# Patient Record
Sex: Female | Born: 1945 | Race: White | Hispanic: No | Marital: Married
Health system: Southern US, Community
[De-identification: ages and names within clinical notes are randomized; demographics above are authoritative.]

## PROBLEM LIST (undated history)

## (undated) DIAGNOSIS — I10 Essential (primary) hypertension: Secondary | ICD-10-CM

## (undated) HISTORY — DX: Essential (primary) hypertension: I10

---

## 2010-12-05 ENCOUNTER — Ambulatory Visit: Payer: Self-pay

## 2012-04-16 IMAGING — CR RIGHT HAND - COMPLETE 3+ VIEW
1 series · 4 of 4 positions shown · non-contrast
Comparison: none

REASON FOR EXAM: use of equipment fax results to [REDACTED]
COMMENTS:

[Series 1: view not recorded · 0.17mm/px · 4 of 4 slices shown]
[im 1/4]
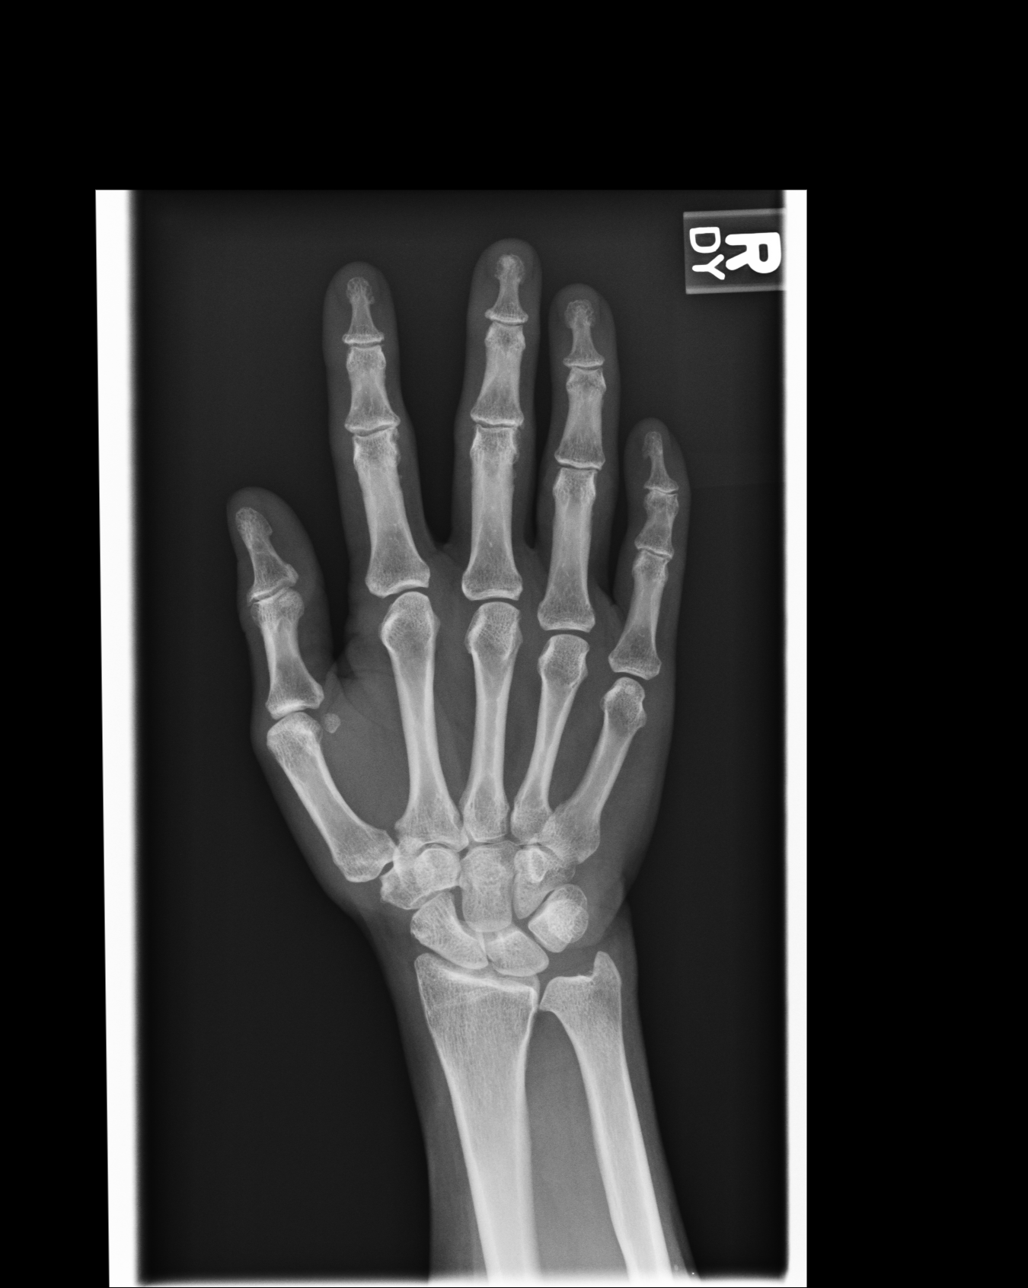
[im 2/4]
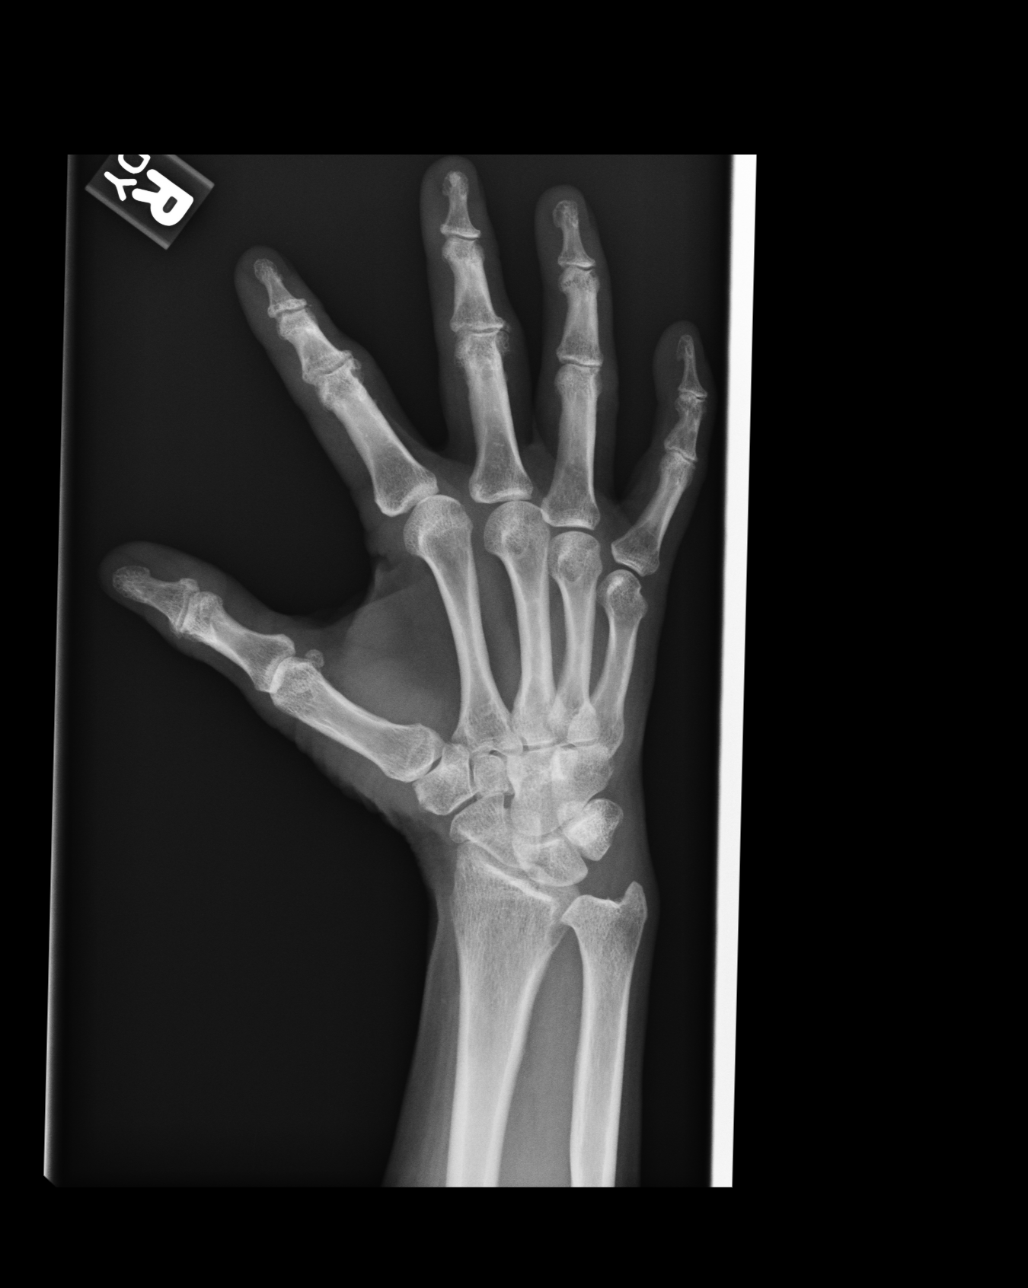
[im 3/4]
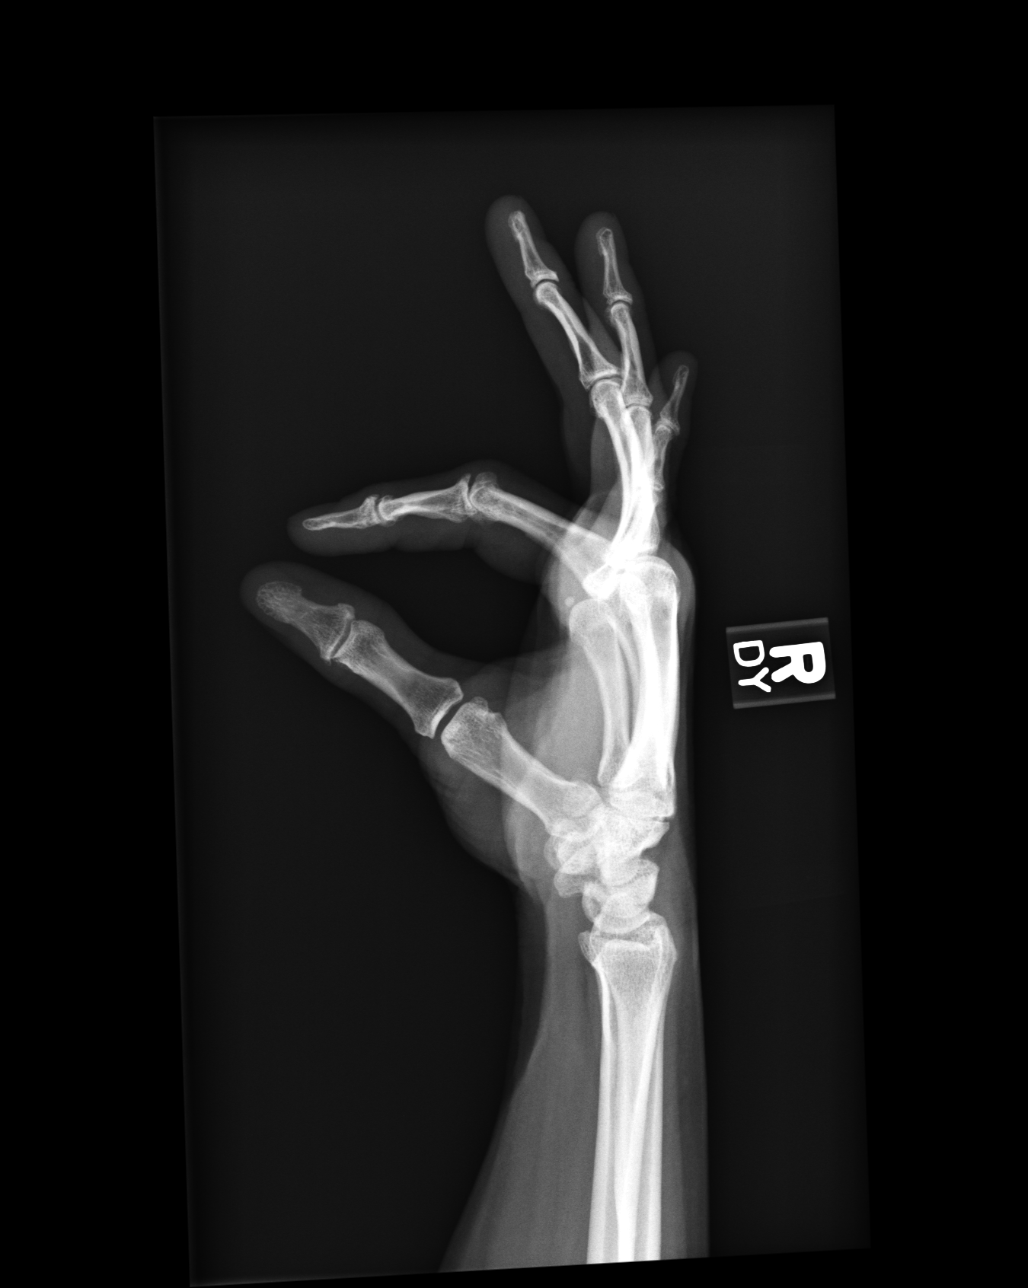
[im 4/4]
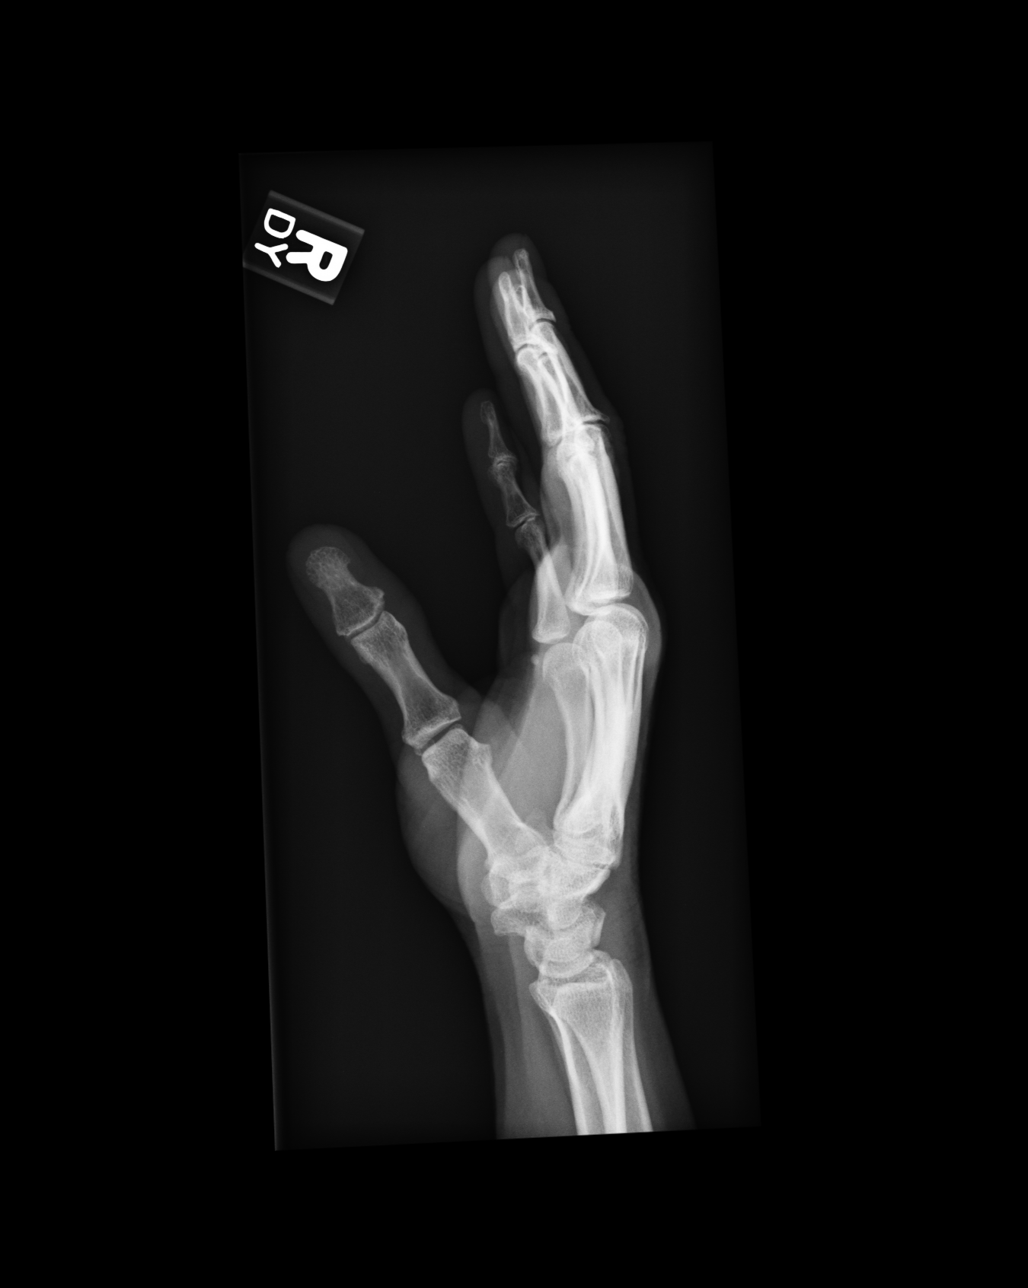

[4 of 4 positions shown; findings below may reference images not displayed]

PROCEDURE:     DXR - DXR HAND RT COMPLETE W/OBLIQUES  - December 05, 2010 [DATE]

RESULT:     Comparison is made to the appearance of the left hand which was
also radiographed on this date. No fracture or dislocation is seen.
Arthritic spurring is noted at the PIP joints of the second and third
fingers. Noted also is mild dorsal spurring of the distal phalanges at the
second through the fifth fingers. Spur formation is also noted at the IP
joint of the thumb.
IMPRESSION: 1.  No fracture or dislocation is seen.
2.  No radiodense soft tissue foreign body is seen.
3.  There are mild arthritic changes at multiple sites as noted above.

## 2017-05-04 ENCOUNTER — Ambulatory Visit (INDEPENDENT_AMBULATORY_CARE_PROVIDER_SITE_OTHER): Payer: Medicare Other | Admitting: Sports Medicine

## 2017-05-04 ENCOUNTER — Encounter: Payer: Self-pay | Admitting: Sports Medicine

## 2017-05-04 DIAGNOSIS — L600 Ingrowing nail: Secondary | ICD-10-CM | POA: Diagnosis not present

## 2017-05-04 DIAGNOSIS — M722 Plantar fascial fibromatosis: Secondary | ICD-10-CM

## 2017-05-04 DIAGNOSIS — M79671 Pain in right foot: Secondary | ICD-10-CM | POA: Diagnosis not present

## 2017-05-04 DIAGNOSIS — M79672 Pain in left foot: Secondary | ICD-10-CM | POA: Diagnosis not present

## 2017-05-04 NOTE — Progress Notes (Signed)
Subjective: Karen Zimmerman is a 71 y.o. female patient presents to office today complaining of ocassional pain at right>left 1st toenail that's concerning for ingrowing, treated with trimimh. Patient denies fever/chills/nausea/vomitting/any other related constitutional symptoms at this time.  Reports also she wants her old orthotics to be looked at and desires possible new set.   There are no active problems to display for this patient.   No current outpatient prescriptions on file prior to visit.   No current facility-administered medications on file prior to visit.     No Known Allergies  Objective:  There were no vitals filed for this visit.  General: Well developed, nourished, in no acute distress, alert and oriented x3   Dermatology: Skin is warm, dry and supple bilateral. Right>left hallux nail appears to be  Mildly incurvated without hyperkeratosis formation at the distal aspects of the medial and lateral nail borders. (-) Erythema. (-) Edema. (-) serosanguous  drainage present. The remaining nails appear unremarkable at this time. There are no open sores, lesions or other signs of infection  present.  Vascular: Dorsalis Pedis artery and Posterior Tibial artery pedal pulses are 1/4 bilateral with immedate capillary fill time. Pedal hair growth present. No lower extremity edema.   Neruologic: Grossly intact via light touch bilateral.  Musculoskeletal: No tenderness to palpation of the right and left hallux nail fold(s). No pain that's reproduced with palpation of plantar fascia. Muscular strength within normal limits in all groups bilateral.   Assesement and Plan: Problem List Items Addressed This Visit    None    Visit Diagnoses    Ingrowing nail    -  Primary   Plantar fasciitis       Foot pain, bilateral          -Discussed treatment alternatives and plan of care; Explained permanent/temporary nail avulsion and post procedure course to patient. -Patient desires to  wait until June for nail procedure -Sent orthotics back to [facility] to be refurbished with thinner top cover; To charge patient  - Patient is to return for nail procedure and PUO or sooner if problems arise.  Asencion Islamitorya Gracee Ratterree, DPM

## 2017-05-04 NOTE — Progress Notes (Signed)
   Subjective:    Patient ID: Karen Zimmerman Meckel, female    DOB: 04-05-1946, 71 y.o.   MRN: [id number]  HPI   I have an ingrown on my right big toe and it has been hurting for a while and burns and itches and I have been cutting on it and I am not a diabetic and is sore and tender if pressed and I have a pair of inserts from 2006 and would like to see if I can get something done for these    Review of Systems  All other systems reviewed and are negative.      Objective:   Physical Exam        Assessment & Plan:

## 2017-05-17 ENCOUNTER — Telehealth: Payer: Self-pay | Admitting: *Deleted

## 2017-05-17 NOTE — Telephone Encounter (Signed)
Patients orthotics have come in and I am going to have [facility] call and have the patient come and pick them up-refurbished. Misty StanleyLisa

## 2017-06-08 ENCOUNTER — Ambulatory Visit (INDEPENDENT_AMBULATORY_CARE_PROVIDER_SITE_OTHER): Payer: Medicare Other | Admitting: Sports Medicine

## 2017-06-08 DIAGNOSIS — M79674 Pain in right toe(s): Secondary | ICD-10-CM

## 2017-06-08 DIAGNOSIS — L600 Ingrowing nail: Secondary | ICD-10-CM

## 2017-06-08 NOTE — Progress Notes (Signed)
Subjective: Karen Zimmerman is a 71 y.o. female patient returns to office today complaining of ocassional pain at righ1st toenail that's concerning for ingrowing, and reports that she is now ready to have the procedure done on her nail. Patient reports issues with callus at side of big toe on right. No other pedal complaints.   There are no active problems to display for this patient.   Current Outpatient Prescriptions on File Prior to Visit  Medication Sig Dispense Refill  . aspirin EC 81 MG tablet Take 81 mg by mouth.    . bisacodyl (DULCOLAX) 5 MG EC tablet Take 5 mg by mouth.    . Cholecalciferol (VITAMIN D-1000 MAX ST) 1000 units tablet Take by mouth.    . diclofenac sodium (VOLTAREN) 1 % GEL Apply topically.    . docusate sodium (COLACE) 100 MG capsule Take 400 mg by mouth.    . hydrochlorothiazide (HYDRODIURIL) 25 MG tablet TAKE 1 TABLET BY MOUTH ONCE DAILY    . losartan (COZAAR) 50 MG tablet TAKE 1 TABLET BY MOUTH DAILY    . Multiple Vitamin (MULTIVITAMIN) tablet Take 1 tablet by mouth daily.    . nortriptyline (PAMELOR) 10 MG capsule Take 40 mg by mouth.    . pravastatin (PRAVACHOL) 10 MG tablet TAKE 1 TABLET BY MOUTH DAILY    . pregabalin (LYRICA) 150 MG capsule TAKE 1 CAPSULE BY MOUTH DAILY    . ranitidine (ZANTAC) 150 MG capsule Take 300 mg by mouth.    . senna (SENOKOT) 8.6 MG TABS tablet Take 1 tablet by mouth.     No current facility-administered medications on file prior to visit.     No Known Allergies  Objective:  There were no vitals filed for this visit.  General: Well developed, nourished, in no acute distress, alert and oriented x3   Dermatology: Skin is warm, dry and supple bilateral. Right>left hallux nail appears to be  Mildly incurvated without hyperkeratosis formation at the distal aspects of the medial and lateral nail borders. (-) Erythema. (-) Edema. (-) serosanguous  drainage present. The remaining nails appear unremarkable at this time. There are no  open sores or other signs of infection present. Reactive callus with hallux deviation on right.  Vascular: Dorsalis Pedis artery and Posterior Tibial artery pedal pulses are 1/4 bilateral with immedate capillary fill time. Pedal hair growth present. No lower extremity edema.   Neruologic: Grossly intact via light touch bilateral.  Musculoskeletal: Mild tenderness to palpation of the right hallux nail fold(s). No pain at left hallux. Muscular strength within normal limits in all groups bilateral.   Assesement and Plan: Problem List Items Addressed This Visit    None    Visit Diagnoses    Ingrowing nail    -  Primary   Toe pain, right         -Discussed treatment alternatives and plan of care; Explained permanent/temporary nail avulsion and post procedure course to patient. Patient opt for PNA on right - After a verbal consent, injected 3 ml of a 50:50 mixture of 2% plain  lidocaine and 0.5% plain marcaine in a normal hallux block fashion. Next, a  betadine prep was performed. Anesthesia was tested and found to be appropriate.  The offending right hallux lateral and medial nail borders were then incised from the hyponychium to the epinychium. The offending nail border was removed and cleared from the field. The area was curretted for any remaining nail or spicules. Phenol application performed and the area was  then flushed with alcohol and dressed with antibiotic cream and a dry sterile dressing. -Patient was instructed to leave the dressing intact for today and begin soaking  in a weak solution of betadine and water tomorrow. Patient was instructed to  soak for 15 minutes each day and apply neosporin and a gauze or bandaid dressing each day. -Patient was instructed to monitor the toe for signs of infection and return to office if toe becomes red, hot or swollen. -Advised if there is pain to ice, elevate and take motrin or tylenol  -Patient to return in 2 weeks for nail check.   Asencion Islamitorya  Reagan Klemz, DPM

## 2017-06-08 NOTE — Patient Instructions (Signed)

## 2017-06-22 ENCOUNTER — Ambulatory Visit (INDEPENDENT_AMBULATORY_CARE_PROVIDER_SITE_OTHER): Payer: Self-pay | Admitting: Sports Medicine

## 2017-06-22 DIAGNOSIS — M79674 Pain in right toe(s): Secondary | ICD-10-CM

## 2017-06-22 DIAGNOSIS — Z9889 Other specified postprocedural states: Secondary | ICD-10-CM

## 2017-06-22 NOTE — Progress Notes (Signed)
Subjective: Mason JimLinda Consiglio is a 71 y.o. female patient returns to office today for follow up evaluation after having Right Hallux medial and lateral permanent nail avulsion performed on 06/08/2017. Patient has been soaking using epsom salt and applying topical antibiotic covered with bandaid daily. Patient deniesfever/chills/nausea/vomitting/any other related constitutional symptoms at this time.  There are no active problems to display for this patient.   Current Outpatient Prescriptions on File Prior to Visit  Medication Sig Dispense Refill  . aspirin EC 81 MG tablet Take 81 mg by mouth.    . bisacodyl (DULCOLAX) 5 MG EC tablet Take 5 mg by mouth.    . Cholecalciferol (VITAMIN D-1000 MAX ST) 1000 units tablet Take by mouth.    . diclofenac sodium (VOLTAREN) 1 % GEL Apply topically.    . docusate sodium (COLACE) 100 MG capsule Take 400 mg by mouth.    . hydrochlorothiazide (HYDRODIURIL) 25 MG tablet TAKE 1 TABLET BY MOUTH ONCE DAILY    . losartan (COZAAR) 50 MG tablet TAKE 1 TABLET BY MOUTH DAILY    . Multiple Vitamin (MULTIVITAMIN) tablet Take 1 tablet by mouth daily.    . nortriptyline (PAMELOR) 10 MG capsule Take 40 mg by mouth.    . pravastatin (PRAVACHOL) 10 MG tablet TAKE 1 TABLET BY MOUTH DAILY    . pregabalin (LYRICA) 150 MG capsule TAKE 1 CAPSULE BY MOUTH DAILY    . ranitidine (ZANTAC) 150 MG capsule Take 300 mg by mouth.    . senna (SENOKOT) 8.6 MG TABS tablet Take 1 tablet by mouth.     No current facility-administered medications on file prior to visit.     No Known Allergies  Objective:  General: Well developed, nourished, in no acute distress, alert and oriented x3   Dermatology: Skin is warm, dry and supple bilateral. Right hallux medial and lateral nail bed appears to be clean, dry, with mild granular tissue and surrounding eschar/scab. (+) Erythema Very faint at proximal nail fold not concerning for infection, likely phenol reaction. (-) Edema. (-) serosanguous drainage  present. The remaining nails appear unremarkable at this time. Minimal Reactive callus at hallux with deviation on right. There are no other lesions or other signs of infection  present.  Neurovascular status: Intact. No lower extremity swelling; No pain with calf compression bilateral.  Musculoskeletal: Decreased tenderness to palpation of the right hallux nail fold(s). Muscular strength within normal limits bilateral.   Assesement and Plan: Problem List Items Addressed This Visit    None    Visit Diagnoses    Status post nail surgery    -  Primary   Toe pain, right          -Examined patient  -Cleansed right Hallux nail folds and gently scrubbed with peroxide and q-tip/curetted away eschar at site and applied antibiotic cream covered with bandaid.  -Discussed plan of care with patient. -Patient to continue soaking in a weak solution of Epsom salt and warm water x 1 week. Patient was instructed to soak for 15-20 minutes each day until the toe appears normal and there is no drainage, redness, tenderness, or swelling at the procedure site, and apply neosporin and a gauze or bandaid dressing each day as needed. May leave open to air at night. -Educated patient on long term care after nail surgery. -Patient was instructed to monitor the toe for reoccurrence and signs of infection; Patient advised to return to office or go to ER if toe becomes red, hot or swollen. -Patient is  to return as needed or sooner if problems arise.  Landis Martins, DPM

## 2017-06-22 NOTE — Patient Instructions (Signed)

## 2017-11-29 ENCOUNTER — Other Ambulatory Visit: Payer: Medicare Other | Admitting: *Deleted

## 2017-11-29 ENCOUNTER — Other Ambulatory Visit: Payer: Medicare Other | Admitting: Orthotics

## 2018-01-31 ENCOUNTER — Ambulatory Visit: Payer: Medicare Other | Admitting: *Deleted

## 2018-01-31 DIAGNOSIS — M79671 Pain in right foot: Secondary | ICD-10-CM

## 2018-01-31 DIAGNOSIS — M722 Plantar fascial fibromatosis: Secondary | ICD-10-CM

## 2018-01-31 DIAGNOSIS — M79672 Pain in left foot: Secondary | ICD-10-CM

## 2018-11-08 NOTE — Progress Notes (Signed)
Patient ID: Karen JimLinda Wempe, female   DOB: 03-23-1946, 72 y.o.   MRN: [id number]   Patient presents for orthotic pick up.  Verbal and written break in and wear instructions given.  Patient will follow up in 4 weeks with Dr Marylene LandStover if symptoms worsen or fail to improve.
